# Patient Record
Sex: Female | Born: 1980 | Race: White | Hispanic: No | Marital: Married | Smoking: Never smoker
Health system: Southern US, Community
[De-identification: ages and names within clinical notes are randomized; demographics above are authoritative.]

## PROBLEM LIST (undated history)

## (undated) DIAGNOSIS — G90A Postural orthostatic tachycardia syndrome (POTS): Secondary | ICD-10-CM

## (undated) DIAGNOSIS — I498 Other specified cardiac arrhythmias: Secondary | ICD-10-CM

## (undated) DIAGNOSIS — R55 Syncope and collapse: Secondary | ICD-10-CM

## (undated) DIAGNOSIS — R000 Tachycardia, unspecified: Secondary | ICD-10-CM

## (undated) DIAGNOSIS — I4891 Unspecified atrial fibrillation: Secondary | ICD-10-CM

## (undated) DIAGNOSIS — I951 Orthostatic hypotension: Secondary | ICD-10-CM

## (undated) HISTORY — DX: Orthostatic hypotension: I95.1

## (undated) HISTORY — DX: Unspecified atrial fibrillation: I48.91

## (undated) HISTORY — DX: Other specified cardiac arrhythmias: I49.8

## (undated) HISTORY — DX: Postural orthostatic tachycardia syndrome (POTS): G90.A

## (undated) HISTORY — DX: Tachycardia, unspecified: R00.0

## (undated) HISTORY — DX: Syncope and collapse: R55

---

## 2015-09-06 ENCOUNTER — Ambulatory Visit (INDEPENDENT_AMBULATORY_CARE_PROVIDER_SITE_OTHER): Payer: BC Managed Care – PPO

## 2015-09-06 ENCOUNTER — Ambulatory Visit (INDEPENDENT_AMBULATORY_CARE_PROVIDER_SITE_OTHER): Payer: BC Managed Care – PPO | Admitting: Urgent Care

## 2015-09-06 VITALS — BP 122/72 | HR 87 | Temp 98.2°F | Resp 17 | Ht 61.0 in | Wt 146.0 lb

## 2015-09-06 DIAGNOSIS — M25531 Pain in right wrist: Secondary | ICD-10-CM

## 2015-09-06 DIAGNOSIS — M25431 Effusion, right wrist: Secondary | ICD-10-CM

## 2015-09-06 DIAGNOSIS — S66911A Strain of unspecified muscle, fascia and tendon at wrist and hand level, right hand, initial encounter: Secondary | ICD-10-CM

## 2015-09-06 NOTE — Patient Instructions (Addendum)
Wrist Pain There are many things that can cause wrist pain. Some common causes include:  An injury to the wrist area, such as a sprain, strain, or fracture.  Overuse of the joint.  A condition that causes increased pressure on a nerve in the wrist (carpal tunnel syndrome).  Wear and tear of the joints that occurs with aging (osteoarthritis).  A variety of other types of arthritis. Sometimes, the cause of wrist pain is not known. The pain often goes away when you follow your health care provider's instructions for relieving pain at home. If your wrist pain continues, tests may need to be done to diagnose your condition. HOME CARE INSTRUCTIONS Pay attention to any changes in your symptoms. Take these actions to help with your pain:  Rest the wrist area for at least 48 hours or as told by your health care provider.  If directed, apply ice to the injured area:  Put ice in a plastic bag.  Place a towel between your skin and the bag.  Leave the ice on for 20 minutes, 2-3 times per day.  Keep your arm raised (elevated) above the level of your heart while you are sitting or lying down.  If a splint or elastic bandage has been applied, use it as told by your health care provider.  Remove the splint or bandage only as told by your health care provider.  Loosen the splint or bandage if your fingers become numb or have a tingling feeling, or if they turn cold or blue.  Take over-the-counter and prescription medicines only as told by your health care provider.  Keep all follow-up visits as told by your health care provider. This is important. SEEK MEDICAL CARE IF:  Your pain is not helped by treatment.  Your pain gets worse. SEEK IMMEDIATE MEDICAL CARE IF:  Your fingers become swollen.  Your fingers turn white, very red, or cold and blue.  Your fingers are numb or have a tingling feeling.  You have difficulty moving your fingers.   This information is not intended to replace  advice given to you by your health care provider. Make sure you discuss any questions you have with your health care provider.   Document Released: 11/09/2004 Document Revised: 10/21/2014 Document Reviewed: 06/17/2014 Elsevier Interactive Patient Education 2016 [employer]     IF you received an x-ray today, you will receive an invoice from [facility]. Please contact [facility] at [phone] with questions or concerns regarding your invoice.   IF you received labwork today, you will receive an invoice from [facility]. Please contact [facility] at [phone] with questions or concerns regarding your invoice.   Our billing staff will not be able to assist you with questions regarding bills from these companies.  You will be contacted with the lab results as soon as they are available. The fastest way to get your results is to activate your My Chart account. Instructions are located on the last page of this paperwork. If you have not heard from us regarding the results in 2 weeks, please contact this office.      

## 2015-09-06 NOTE — Progress Notes (Signed)
    MRN: [id number] DOB: [birth year]  Subjective:   Rebecca Harrington is a 35 y.o. female presenting for chief complaint of Wrist Pain (right side)  Reports 1 day history of right wrist pain, decreased ROM, swelling, popping. Has tried icing with minimal relief. Reports that she was moving yesterday and felt her wrist strain when she tried to lift a really heavy box. She tried to go to work today but her wrist pain has worsened. Of note, patient has atrial fibrillation, postural orthostatic tachycardia. She's on Plavix, metoprolol and diltiazem as needed for this.  Rebecca Harrington has a current medication list which includes the following prescription(s): clopidogrel, diltiazem, metoprolol succinate, and rizatriptan. Also is allergic to motrin [ibuprofen] and zithromax [azithromycin].  Rebecca Harrington  has a past medical history of Atrial fibrillation (HCC); Postural orthostatic tachycardia syndrome; and Syncope. Also  has no past surgical history on file.  Objective:   Vitals: BP 122/72   Pulse 87   Temp 98.2 F (36.8 C) (Oral)   Resp 17   Ht 5\' 1"  (1.549 m)   Wt 146 lb (66.2 kg)   LMP 08/30/2015 (Approximate)   SpO2 99%   BMI 27.59 kg/m   Physical Exam  Constitutional: She is oriented to person, place, and time. She appears well-developed and well-nourished.  Cardiovascular: Normal rate.   Pulmonary/Chest: Effort normal.  Musculoskeletal:       Right wrist: She exhibits decreased range of motion (extension>flexion), tenderness (either side of her wrist and dorsal aspect) and swelling (trace). She exhibits no bony tenderness, no effusion, no crepitus, no deformity and no laceration.       Right hand: She exhibits normal range of motion, no tenderness, no bony tenderness, normal two-point discrimination, normal capillary refill, no deformity, no laceration and no swelling. Normal sensation noted. Normal strength noted.  Neurological: She is alert and oriented to person, place, and time.   Dg Wrist  Complete Right  Result Date: 09/06/2015 CLINICAL DATA:  Straining injury of the wrist when lifting a heavy box yesterday ; persistent pain and decreased range of motion with swelling and popping sensation. EXAM: RIGHT WRIST - COMPLETE 3+ VIEW COMPARISON:  None in PACs FINDINGS: The bones are subjectively adequately mineralized. There is no acute fracture nor dislocation. There is no joint effusion. IMPRESSION: There is no acute bony abnormality of the right wrist. Electronically Signed   By: David  [place] M.D.   On: 09/06/2015 11:26    Assessment and Plan :   1. Wrist strain, right, initial encounter 2. Right wrist pain 3. Pain and swelling of right wrist - Will manage conservatively for now, use APAP, ACE wrap, follow work restrictions. RTC in 1 week if no improvement. Consider referral to ortho or PT at that point. Patient agreed.  Wallis Bamberg, PA-C [facility] [phone] 09/06/2015 10:38 AM

## 2017-05-03 IMAGING — DX DG WRIST COMPLETE 3+V*R*
4 series · 4 of 4 positions shown · non-contrast
Comparison: None in PACs

CLINICAL DATA: Straining injury of the wrist when lifting a heavy
box yesterday ; persistent pain and decreased range of motion with
swelling and popping sensation.

EXAM:
RIGHT WRIST - COMPLETE 3+ VIEW

[wrist pa]
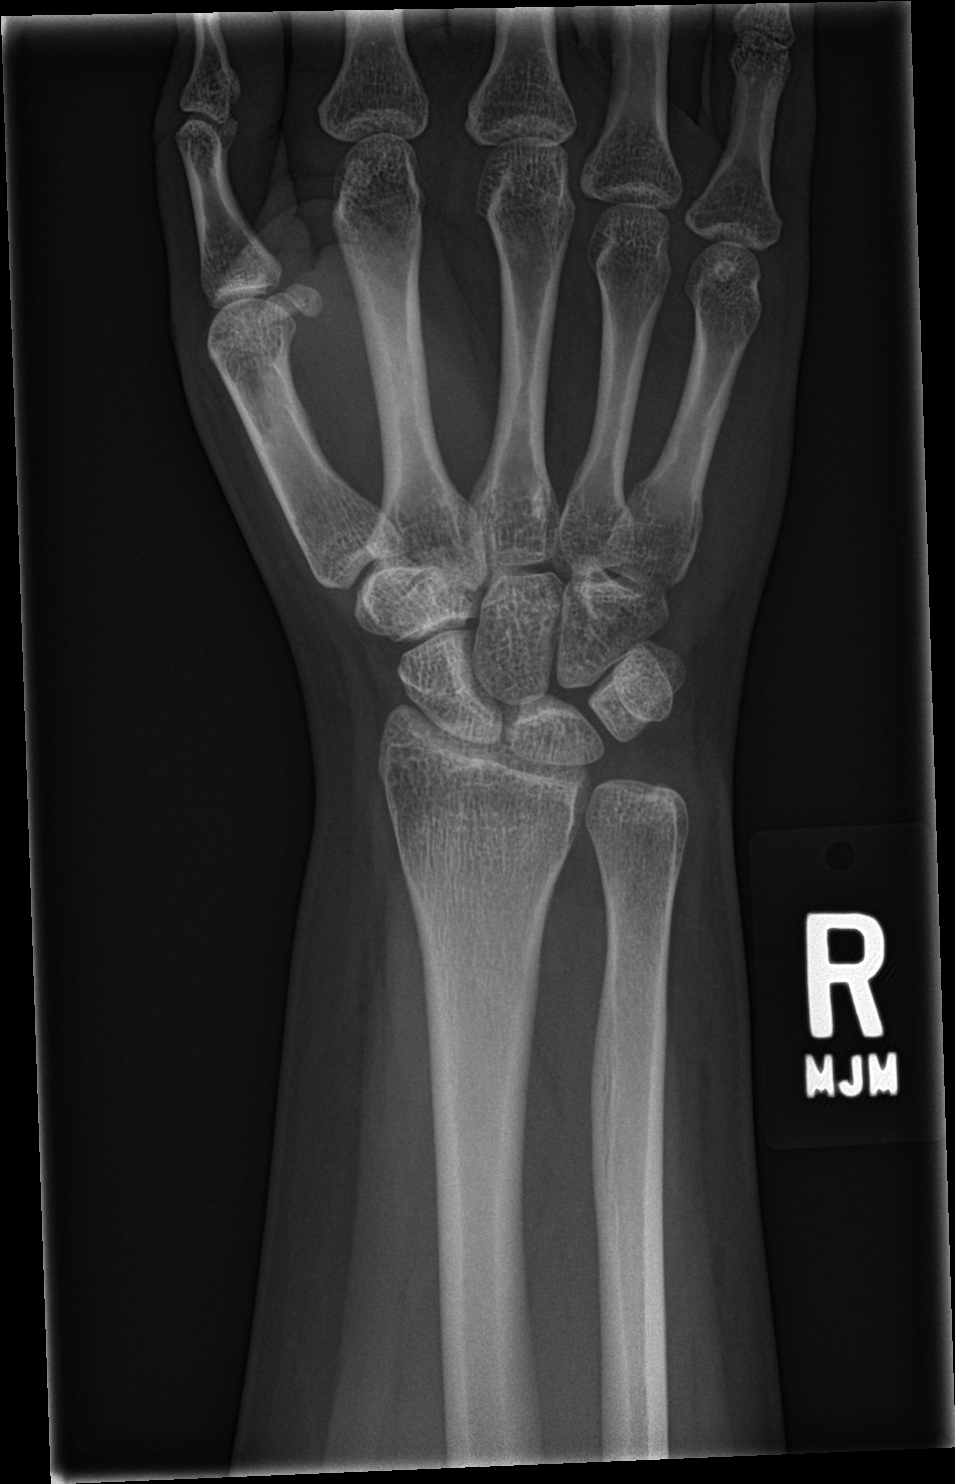

[wrist obl]
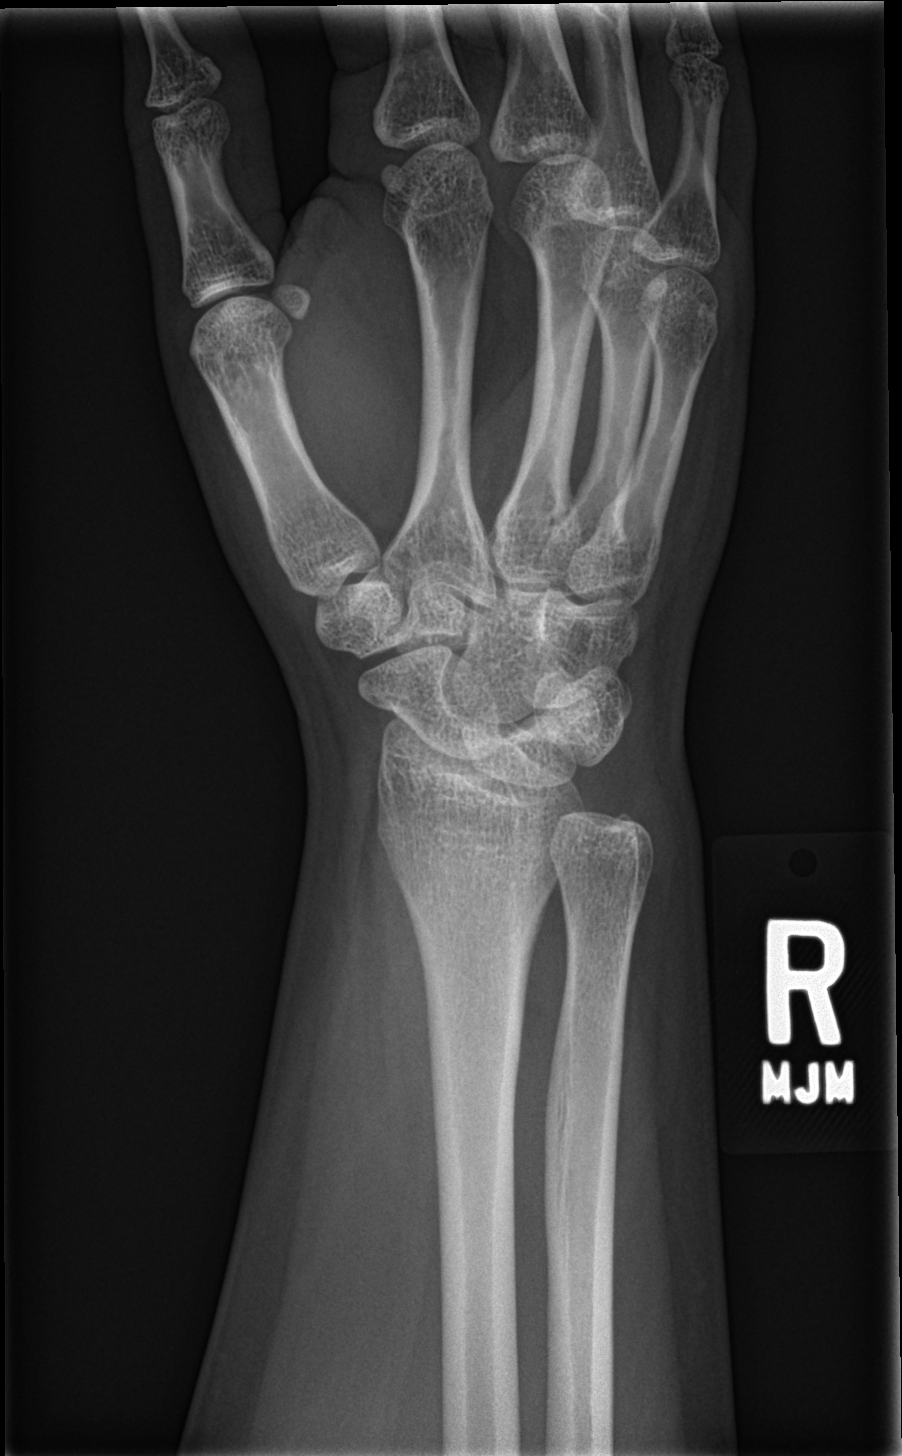

[wrist lat]
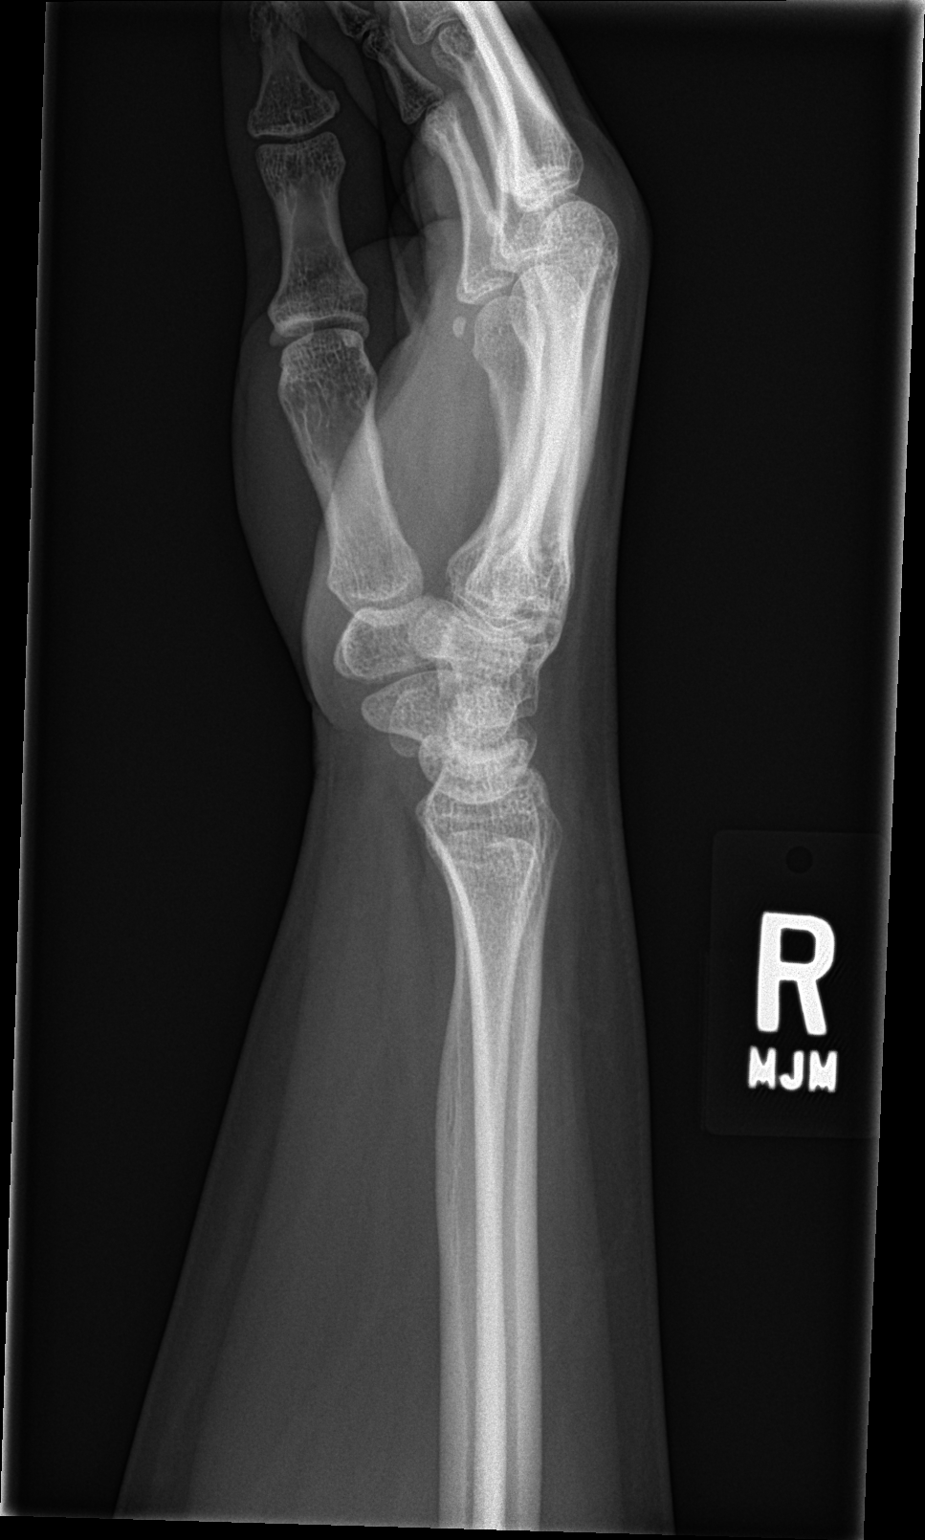

[wrist navicular]
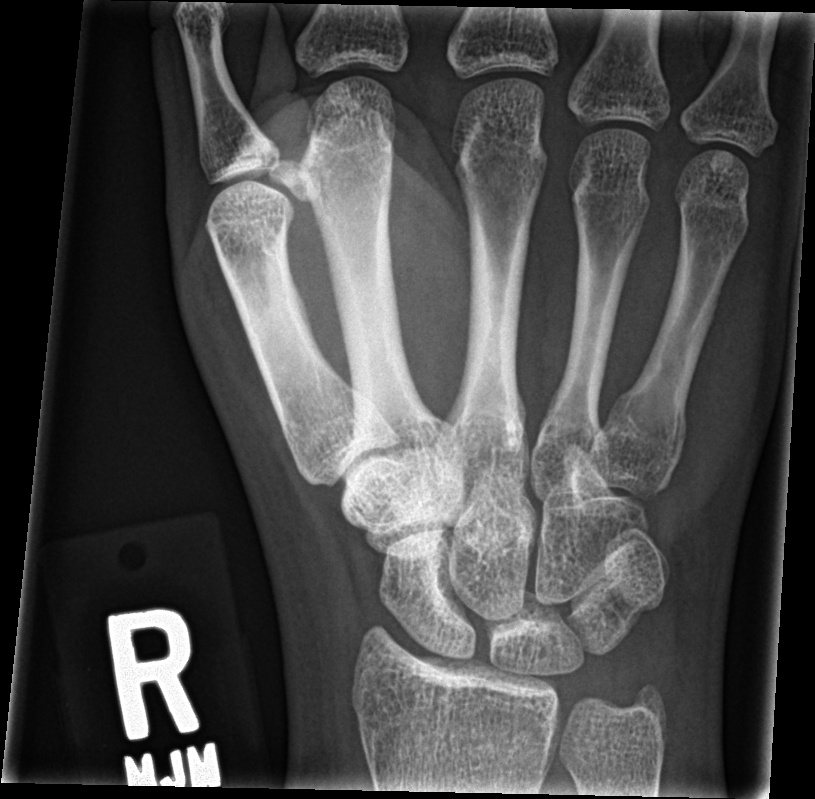

[4 of 4 positions shown; findings below may reference images not displayed]

FINDINGS: The bones are subjectively adequately mineralized. There is no acute
fracture nor dislocation. There is no joint effusion.
IMPRESSION: There is no acute bony abnormality of the right wrist.
